# Patient Record
Sex: Male | Born: 1985 | Race: White | Hispanic: No | Marital: Married | Smoking: Never smoker
Health system: Southern US, Community
[De-identification: ages and names within clinical notes are randomized; demographics above are authoritative.]

## PROBLEM LIST (undated history)

## (undated) DIAGNOSIS — M549 Dorsalgia, unspecified: Secondary | ICD-10-CM

---

## 2012-02-25 ENCOUNTER — Emergency Department (HOSPITAL_COMMUNITY)
Admission: EM | Admit: 2012-02-25 | Discharge: 2012-02-25 | Disposition: A | Discharge status: Home / Self Care | Payer: Self-pay | Attending: Emergency Medicine | Admitting: Emergency Medicine

## 2012-02-25 ENCOUNTER — Encounter (HOSPITAL_COMMUNITY): Payer: Self-pay | Admitting: *Deleted

## 2012-02-25 DIAGNOSIS — R112 Nausea with vomiting, unspecified: Secondary | ICD-10-CM | POA: Insufficient documentation

## 2012-02-25 DIAGNOSIS — R109 Unspecified abdominal pain: Secondary | ICD-10-CM | POA: Insufficient documentation

## 2012-02-25 DIAGNOSIS — R1110 Vomiting, unspecified: Secondary | ICD-10-CM | POA: Insufficient documentation

## 2012-02-25 LAB — CBC
HCT: 47.2 % (ref 39.0–52.0)
Platelets: 220 10*3/uL (ref 150–400)
RBC: 5.24 MIL/uL (ref 4.22–5.81)
RDW: 12 % (ref 11.5–15.5)
WBC: 10.1 10*3/uL (ref 4.0–10.5)

## 2012-02-25 LAB — DIFFERENTIAL
Basophils Absolute: 0 10*3/uL (ref 0.0–0.1)
Lymphocytes Relative: 18 % (ref 12–46)
Lymphs Abs: 1.9 10*3/uL (ref 0.7–4.0)
Monocytes Absolute: 0.8 10*3/uL (ref 0.1–1.0)
Neutro Abs: 7.5 10*3/uL (ref 1.7–7.7)

## 2012-02-25 LAB — COMPREHENSIVE METABOLIC PANEL
ALT: 22 U/L (ref 0–53)
AST: 40 U/L — ABNORMAL HIGH (ref 0–37)
CO2: 30 mEq/L (ref 19–32)
Chloride: 97 mEq/L (ref 96–112)
GFR calc non Af Amer: 73 mL/min — ABNORMAL LOW (ref >90)
Sodium: 140 mEq/L (ref 135–145)
Total Bilirubin: 0.9 mg/dL (ref 0.3–1.2)

## 2012-02-25 LAB — URINALYSIS, ROUTINE W REFLEX MICROSCOPIC
Bilirubin Urine: NEGATIVE
Glucose, UA: NEGATIVE mg/dL
Hgb urine dipstick: NEGATIVE
Ketones, ur: NEGATIVE mg/dL
pH: 7 (ref 5.0–8.0)

## 2012-02-25 MED ORDER — PROMETHAZINE HCL 25 MG PO TABS: 25.0000 mg | ORAL_TABLET | Freq: Four times a day (QID) | ORAL | Status: DC | PRN

## 2012-02-25 MED ORDER — ONDANSETRON 4 MG PO TBDP
8.0000 mg | ORAL_TABLET | Freq: Once | ORAL | Status: AC
  Administered 2012-02-25: 8 mg via ORAL
  Filled 2012-02-25: qty 2

## 2012-02-25 MED ORDER — OXYCODONE-ACETAMINOPHEN 5-325 MG PO TABS
1.0000 | ORAL_TABLET | Freq: Once | ORAL | Status: AC
  Administered 2012-02-25: 1 via ORAL
  Filled 2012-02-25: qty 1

## 2012-02-25 MED ORDER — HYDROCODONE-ACETAMINOPHEN 5-325 MG PO TABS: 1.0000 | ORAL_TABLET | Freq: Four times a day (QID) | ORAL | Status: AC | PRN

## 2012-02-25 NOTE — ED Notes (Signed)
Pt ambulated with a steady gait; VSS; A&Ox3; no signs of distress; respirations even and unlabored; skin warm and dry. No questions at this time.  

## 2012-02-25 NOTE — ED Notes (Signed)
Epigastric pain since last night. Intermittent, sharp pain. Emesis last night from 11 pm 0600. States, "blood emesis."

## 2012-02-25 NOTE — ED Provider Notes (Signed)
Medical screening examination/treatment/procedure(s) were performed by non-physician practitioner and as supervising physician I was immediately available for consultation/collaboration.   Celene Kras, MD 02/25/12 2038

## 2012-02-25 NOTE — ED Notes (Signed)
PT has kept down cup of water.

## 2012-02-25 NOTE — ED Notes (Signed)
PT reports his is feeling better; pain decreased. Will attempt fluid challenge.

## 2012-02-25 NOTE — ED Provider Notes (Signed)
History     CSN: [id number]  Arrival date & time 02/25/12  1519   First MD Initiated Contact with Patient 02/25/12 1840      Chief Complaint  Patient presents with  . Abdominal Pain    (Consider location/radiation/quality/duration/timing/severity/associated sxs/prior treatment) HPI Comments: Patient with no significant past medical history presents emergency department chief complaint of epigastric pain.  Onset of pain began last night around 11 p.m. and patient has had 5 episodes of emesis last night and 2 this morning.  The last emesis episode had bright red blood.  Patient denies any hematochezia, fevers, night sweats, chills, melena or abdominal pain.  Patient denies smoking, alcohol use, or anti-inflammatory use including Motrin, ibuprofen and Aleve.  Patient has no other complaints this time.  Patient is a 26 y.o. male presenting with abdominal pain. The history is provided by the patient.  Abdominal Pain The primary symptoms of the illness include abdominal pain, nausea and vomiting. The primary symptoms of the illness do not include fever, shortness of breath, diarrhea or dysuria.  Symptoms associated with the illness do not include chills, constipation, urgency or hematuria.    History reviewed. No pertinent past medical history.  History reviewed. No pertinent past surgical history.  History reviewed. No pertinent family history.  History  Substance Use Topics  . Smoking status: Not on file  . Smokeless tobacco: Not on file  . Alcohol Use: Yes      Review of Systems  Constitutional: Positive for appetite change. Negative for fever and chills.  HENT: Negative for neck stiffness and dental problem.   Eyes: Negative for visual disturbance.  Respiratory: Negative for cough, chest tightness, shortness of breath and wheezing.   Cardiovascular: Negative for chest pain.  Gastrointestinal: Positive for nausea, vomiting and abdominal pain. Negative for diarrhea,  constipation, blood in stool, abdominal distention, anal bleeding and rectal pain.  Genitourinary: Negative for dysuria, urgency, hematuria and flank pain.  Musculoskeletal: Negative for myalgias and arthralgias.  Skin: Negative for rash.  Neurological: Negative for dizziness, syncope, speech difficulty, numbness and headaches.  Hematological: Does not bruise/bleed easily.  All other systems reviewed and are negative.    Allergies  Review of patient's allergies indicates no known allergies.  Home Medications  No current outpatient prescriptions on file.  BP 141/86  Pulse 66  Temp 98.5 F (36.9 C) (Oral)  Resp 18  Ht 5\' 10"  (1.778 m)  Wt 180 lb (81.647 kg)  BMI 25.83 kg/m2  SpO2 100%  Physical Exam  Constitutional: He is oriented to person, place, and time. He appears well-developed and well-nourished. No distress.  HENT:  Head: Normocephalic and atraumatic.  Mouth/Throat: Oropharynx is clear and moist. No oropharyngeal exudate.  Eyes: Conjunctivae and EOM are normal. Pupils are equal, round, and reactive to light. No scleral icterus.  Neck: Normal range of motion. Neck supple. No tracheal deviation present. No thyromegaly present.  Cardiovascular: Normal rate, regular rhythm, normal heart sounds and intact distal pulses.   Pulmonary/Chest: Effort normal and breath sounds normal. No stridor. No respiratory distress. He has no wheezes.  Abdominal: Soft.       No localized ttp. Bowel sounds present.   Musculoskeletal: Normal range of motion. He exhibits no edema and no tenderness.  Neurological: He is alert and oriented to person, place, and time. Coordination normal.  Skin: Skin is warm and dry. No rash noted. He is not diaphoretic. No erythema. No pallor.  Psychiatric: He has a normal mood and affect. His  behavior is normal.    ED Course  Procedures (including critical care time)  Labs Reviewed  COMPREHENSIVE METABOLIC PANEL - Abnormal; Notable for the following:     Glucose, Bld 103 (*)     Total Protein 8.6 (*)     AST 40 (*)     GFR calc non Af Amer 73 (*)     GFR calc Af Amer 85 (*)     All other components within normal limits  CBC  DIFFERENTIAL  AMYLASE  LIPASE, BLOOD  URINALYSIS, ROUTINE W REFLEX MICROSCOPIC   No results found.   No diagnosis found.    MDM  Emesis   Patient to ED w c/o emesis and epigastric abd pain.  Vitals arenormal, no fever.  No signs of dehydration, tolerating PO fluids > 6 oz.  Lungs are clear.  No focal abdominal pain, no concern for appendicitis, cholecystitis, pancreatitis, ruptured viscus, UTI, kidney stone, or any other abdominal etiology.  Supportive therapy indicated with return if symptoms worsen.  Patient counseled.         Jaci Carrel, [place] 02/25/12 2031

## 2013-03-11 ENCOUNTER — Other Ambulatory Visit: Payer: Self-pay | Admitting: Orthopedic Surgery

## 2013-03-11 ENCOUNTER — Encounter (HOSPITAL_COMMUNITY): Payer: Self-pay | Admitting: *Deleted

## 2013-03-11 NOTE — H&P (Signed)
Shane Jimenez is an 27 y.o. male.   Chief Complaint: back and left leg pain HPI: back and left leg pain since March when he jumped while playing basketball. He has ongoing weakness, numbness, tingling LLE refractory to conservative tx.  Past Medical History  Diagnosis Date  . Acute back pain     No past surgical history on file.  No family history on file. Social History:  reports that he has never smoked. He does not have any smokeless tobacco history on file. He reports that he does not drink alcohol or use illicit drugs.  Allergies: No Known Allergies   (Not in a hospital admission)  No results found for this or any previous visit (from the past 48 hour(s)). No results found.  Review of Systems  Constitutional: Negative.   HENT: Negative.   Eyes: Negative.   Cardiovascular: Negative.   Gastrointestinal: Negative.   Genitourinary: Negative.   Musculoskeletal: Positive for back pain.  Skin: Negative.   Neurological: Positive for sensory change and focal weakness.  Psychiatric/Behavioral: Negative.     There were no vitals taken for this visit. Physical Exam  Constitutional: He is oriented to person, place, and time. He appears well-developed and well-nourished. He appears distressed.  HENT:  Head: Normocephalic and atraumatic.  Eyes: Conjunctivae and EOM are normal. Pupils are equal, round, and reactive to light.  Neck: Normal range of motion. Neck supple.  Cardiovascular: Normal rate and regular rhythm.   Respiratory: Effort normal and breath sounds normal.  GI: Soft. Bowel sounds are normal.  Musculoskeletal:  Limited ROM Lspine + SLR Decreased sensation LLE Weakness L  EHL  Neurological: He is alert and oriented to person, place, and time.  Skin: Skin is warm and dry.  Psychiatric: He has a normal mood and affect.    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 All questions answered and pt desires to proceed.  Plan microlumbar decompression L4-5   BISSELL, JACLYN M. for Dr. Shelle Iron 03/11/2013, 5:33 PM

## 2013-03-12 ENCOUNTER — Encounter (HOSPITAL_COMMUNITY): Payer: Self-pay | Admitting: Anesthesiology

## 2013-03-12 ENCOUNTER — Encounter (HOSPITAL_COMMUNITY): Payer: Self-pay | Admitting: *Deleted

## 2013-03-12 ENCOUNTER — Ambulatory Visit (HOSPITAL_COMMUNITY): Payer: BC Managed Care – PPO | Admitting: Anesthesiology

## 2013-03-12 ENCOUNTER — Encounter (HOSPITAL_COMMUNITY)
Admission: RE | Disposition: A | Discharge status: Home / Self Care | Payer: Self-pay | Source: Ambulatory Visit | Attending: Specialist

## 2013-03-12 ENCOUNTER — Ambulatory Visit (HOSPITAL_COMMUNITY): Payer: BC Managed Care – PPO

## 2013-03-12 ENCOUNTER — Ambulatory Visit (HOSPITAL_COMMUNITY)
Admission: RE | Admit: 2013-03-12 | Discharge: 2013-03-13 | Disposition: A | Discharge status: Home / Self Care | Payer: BC Managed Care – PPO | Source: Ambulatory Visit | Attending: Specialist | Admitting: Specialist

## 2013-03-12 DIAGNOSIS — M48061 Spinal stenosis, lumbar region without neurogenic claudication: Secondary | ICD-10-CM | POA: Insufficient documentation

## 2013-03-12 DIAGNOSIS — M48062 Spinal stenosis, lumbar region with neurogenic claudication: Secondary | ICD-10-CM

## 2013-03-12 DIAGNOSIS — M5126 Other intervertebral disc displacement, lumbar region: Secondary | ICD-10-CM | POA: Insufficient documentation

## 2013-03-12 HISTORY — DX: Dorsalgia, unspecified: M54.9

## 2013-03-12 HISTORY — PX: LUMBAR LAMINECTOMY/DECOMPRESSION MICRODISCECTOMY: SHX5026

## 2013-03-12 SURGERY — LUMBAR LAMINECTOMY/DECOMPRESSION MICRODISCECTOMY
Anesthesia: General | Site: Spine Lumbar | Wound class: Clean

## 2013-03-12 MED ORDER — MENTHOL 3 MG MT LOZG: 1.0000 | LOZENGE | OROMUCOSAL | Status: DC | PRN

## 2013-03-12 MED ORDER — CEFAZOLIN SODIUM-DEXTROSE 2-3 GM-% IV SOLR
INTRAVENOUS | Status: AC
  Filled 2013-03-12: qty 50

## 2013-03-12 MED ORDER — LACTATED RINGERS IV SOLN
INTRAVENOUS | Status: DC
  Administered 2013-03-12 (×2): via INTRAVENOUS

## 2013-03-12 MED ORDER — BUPIVACAINE-EPINEPHRINE (PF) 0.5% -1:200000 IJ SOLN
INTRAMUSCULAR | Status: AC
  Filled 2013-03-12: qty 10

## 2013-03-12 MED ORDER — ONDANSETRON HCL 4 MG/2ML IJ SOLN
INTRAMUSCULAR | Status: DC | PRN
  Administered 2013-03-12: 4 mg via INTRAVENOUS

## 2013-03-12 MED ORDER — FENTANYL CITRATE 0.05 MG/ML IJ SOLN
INTRAMUSCULAR | Status: DC | PRN
  Administered 2013-03-12: 100 ug via INTRAVENOUS
  Administered 2013-03-12: 50 ug via INTRAVENOUS
  Administered 2013-03-12: 100 ug via INTRAVENOUS
  Administered 2013-03-12 (×2): 50 ug via INTRAVENOUS

## 2013-03-12 MED ORDER — SODIUM CHLORIDE 0.9 % IJ SOLN: 3.0000 mL | Freq: Two times a day (BID) | INTRAMUSCULAR | Status: DC

## 2013-03-12 MED ORDER — ONDANSETRON HCL 4 MG/2ML IJ SOLN
4.0000 mg | INTRAMUSCULAR | Status: DC | PRN
  Administered 2013-03-13: 4 mg via INTRAVENOUS
  Filled 2013-03-12: qty 2

## 2013-03-12 MED ORDER — BUPIVACAINE-EPINEPHRINE 0.5% -1:200000 IJ SOLN
INTRAMUSCULAR | Status: DC | PRN
  Administered 2013-03-12: 5 mL

## 2013-03-12 MED ORDER — METHOCARBAMOL 500 MG PO TABS
500.0000 mg | ORAL_TABLET | Freq: Four times a day (QID) | ORAL | Status: DC | PRN
  Administered 2013-03-13 (×2): 500 mg via ORAL
  Filled 2013-03-12 (×3): qty 1

## 2013-03-12 MED ORDER — OXYCODONE-ACETAMINOPHEN 5-325 MG PO TABS
1.0000 | ORAL_TABLET | ORAL | Status: DC | PRN
  Administered 2013-03-13: 2 via ORAL
  Filled 2013-03-12: qty 2

## 2013-03-12 MED ORDER — LIDOCAINE HCL (CARDIAC) 20 MG/ML IV SOLN
INTRAVENOUS | Status: DC | PRN
  Administered 2013-03-12: 50 mg via INTRAVENOUS

## 2013-03-12 MED ORDER — HYDROCODONE-ACETAMINOPHEN 7.5-325 MG PO TABS: 1.0000 | ORAL_TABLET | ORAL | Status: AC | PRN

## 2013-03-12 MED ORDER — PROMETHAZINE HCL 25 MG/ML IJ SOLN: 6.2500 mg | INTRAMUSCULAR | Status: DC | PRN

## 2013-03-12 MED ORDER — HYDROCODONE-ACETAMINOPHEN 5-325 MG PO TABS
1.0000 | ORAL_TABLET | ORAL | Status: DC | PRN
  Filled 2013-03-12: qty 2

## 2013-03-12 MED ORDER — CEFAZOLIN SODIUM-DEXTROSE 2-3 GM-% IV SOLR
2.0000 g | Freq: Three times a day (TID) | INTRAVENOUS | Status: AC
  Administered 2013-03-12 – 2013-03-13 (×2): 2 g via INTRAVENOUS
  Filled 2013-03-12 (×2): qty 50

## 2013-03-12 MED ORDER — THROMBIN 5000 UNITS EX SOLR
CUTANEOUS | Status: AC
  Filled 2013-03-12: qty 10000

## 2013-03-12 MED ORDER — POLYMYXIN B SULFATE 500000 UNITS IJ SOLR
INTRAMUSCULAR | Status: DC | PRN
  Administered 2013-03-12: 16:00:00

## 2013-03-12 MED ORDER — HYDROMORPHONE HCL PF 1 MG/ML IJ SOLN
0.5000 mg | INTRAMUSCULAR | Status: DC | PRN
  Administered 2013-03-12 (×2): 0.5 mg via INTRAVENOUS
  Administered 2013-03-13 (×3): 1 mg via INTRAVENOUS
  Filled 2013-03-12 (×5): qty 1

## 2013-03-12 MED ORDER — SODIUM CHLORIDE 0.45 % IV SOLN
INTRAVENOUS | Status: DC
  Administered 2013-03-12: 21:00:00 via INTRAVENOUS

## 2013-03-12 MED ORDER — THROMBIN 5000 UNITS EX SOLR
CUTANEOUS | Status: DC | PRN
  Administered 2013-03-12: 10000 [IU] via TOPICAL

## 2013-03-12 MED ORDER — CLINDAMYCIN PHOSPHATE 900 MG/50ML IV SOLN
INTRAVENOUS | Status: DC | PRN
  Administered 2013-03-12: 900 mg via INTRAVENOUS

## 2013-03-12 MED ORDER — HYDROMORPHONE HCL PF 1 MG/ML IJ SOLN: 0.2500 mg | INTRAMUSCULAR | Status: DC | PRN

## 2013-03-12 MED ORDER — ROCURONIUM BROMIDE 100 MG/10ML IV SOLN
INTRAVENOUS | Status: DC | PRN
  Administered 2013-03-12: 10 mg via INTRAVENOUS
  Administered 2013-03-12: 20 mg via INTRAVENOUS
  Administered 2013-03-12 (×2): 10 mg via INTRAVENOUS
  Administered 2013-03-12: 40 mg via INTRAVENOUS
  Administered 2013-03-12: 5 mg via INTRAVENOUS

## 2013-03-12 MED ORDER — ACETAMINOPHEN 325 MG PO TABS: 650.0000 mg | ORAL_TABLET | ORAL | Status: DC | PRN

## 2013-03-12 MED ORDER — SODIUM CHLORIDE 0.9 % IV SOLN: 250.0000 mL | INTRAVENOUS | Status: DC

## 2013-03-12 MED ORDER — CEFAZOLIN SODIUM-DEXTROSE 2-3 GM-% IV SOLR
2.0000 g | INTRAVENOUS | Status: AC
  Administered 2013-03-12: 2 g via INTRAVENOUS

## 2013-03-12 MED ORDER — SODIUM CHLORIDE 0.9 % IJ SOLN: 3.0000 mL | INTRAMUSCULAR | Status: DC | PRN

## 2013-03-12 MED ORDER — MUPIROCIN 2 % EX OINT
TOPICAL_OINTMENT | CUTANEOUS | Status: AC
  Administered 2013-03-12: 1
  Filled 2013-03-12: qty 22

## 2013-03-12 MED ORDER — MIDAZOLAM HCL 5 MG/5ML IJ SOLN
INTRAMUSCULAR | Status: DC | PRN
  Administered 2013-03-12: 2 mg via INTRAVENOUS

## 2013-03-12 MED ORDER — METHOCARBAMOL 500 MG PO TABS: 500.0000 mg | ORAL_TABLET | Freq: Three times a day (TID) | ORAL | Status: AC | PRN

## 2013-03-12 MED ORDER — BUPIVACAINE-EPINEPHRINE PF 0.25-1:200000 % IJ SOLN
INTRAMUSCULAR | Status: AC
  Filled 2013-03-12: qty 30

## 2013-03-12 MED ORDER — DEXTROSE 5 % IV SOLN
500.0000 mg | Freq: Four times a day (QID) | INTRAVENOUS | Status: DC | PRN
  Filled 2013-03-12: qty 5

## 2013-03-12 MED ORDER — LACTATED RINGERS IV SOLN: INTRAVENOUS | Status: DC

## 2013-03-12 MED ORDER — CLINDAMYCIN PHOSPHATE 900 MG/50ML IV SOLN
INTRAVENOUS | Status: AC
  Filled 2013-03-12: qty 50

## 2013-03-12 MED ORDER — NEOSTIGMINE METHYLSULFATE 1 MG/ML IJ SOLN
INTRAMUSCULAR | Status: DC | PRN
  Administered 2013-03-12: 4 mg via INTRAVENOUS

## 2013-03-12 MED ORDER — PHENYLEPHRINE HCL 10 MG/ML IJ SOLN
INTRAMUSCULAR | Status: DC | PRN
  Administered 2013-03-12: 40 ug via INTRAVENOUS

## 2013-03-12 MED ORDER — DOCUSATE SODIUM 100 MG PO CAPS
100.0000 mg | ORAL_CAPSULE | Freq: Two times a day (BID) | ORAL | Status: DC
  Administered 2013-03-12 – 2013-03-13 (×2): 100 mg via ORAL
  Filled 2013-03-12 (×3): qty 1

## 2013-03-12 MED ORDER — CHLORHEXIDINE GLUCONATE 4 % EX LIQD
60.0000 mL | Freq: Once | CUTANEOUS | Status: DC
  Filled 2013-03-12: qty 60

## 2013-03-12 MED ORDER — PHENOL 1.4 % MT LIQD: 1.0000 | OROMUCOSAL | Status: DC | PRN

## 2013-03-12 MED ORDER — GLYCOPYRROLATE 0.2 MG/ML IJ SOLN
INTRAMUSCULAR | Status: DC | PRN
  Administered 2013-03-12: .7 mg via INTRAVENOUS

## 2013-03-12 MED ORDER — ACETAMINOPHEN 650 MG RE SUPP: 650.0000 mg | RECTAL | Status: DC | PRN

## 2013-03-12 SURGICAL SUPPLY — 54 items
BAG ZIPLOCK 12X15 (MISCELLANEOUS) ×2 IMPLANT
CHLORAPREP W/TINT 26ML (MISCELLANEOUS) IMPLANT
CLEANER TIP ELECTROSURG 2X2 (MISCELLANEOUS) ×2 IMPLANT
CLOTH 2% CHLOROHEXIDINE 3PK (PERSONAL CARE ITEMS) ×2 IMPLANT
CLOTH BEACON ORANGE TIMEOUT ST (SAFETY) ×2 IMPLANT
DECANTER SPIKE VIAL GLASS SM (MISCELLANEOUS) IMPLANT
DRAPE MICROSCOPE LEICA (MISCELLANEOUS) ×2 IMPLANT
DRAPE POUCH INSTRU U-SHP 10X18 (DRAPES) ×2 IMPLANT
DRAPE SURG 17X11 SM STRL (DRAPES) ×2 IMPLANT
DRSG AQUACEL AG ADV 3.5X 4 (GAUZE/BANDAGES/DRESSINGS) ×2 IMPLANT
DRSG AQUACEL AG ADV 3.5X 6 (GAUZE/BANDAGES/DRESSINGS) IMPLANT
DRSG EMULSION OIL 3X3 NADH (GAUZE/BANDAGES/DRESSINGS) ×2 IMPLANT
DRSG PAD ABDOMINAL 8X10 ST (GAUZE/BANDAGES/DRESSINGS) IMPLANT
DRSG TELFA 4X5 ISLAND ADH (GAUZE/BANDAGES/DRESSINGS) IMPLANT
DURAFORM SPONGE 2X2 SINGLE (Neuro Prosthesis/Implant) ×2 IMPLANT
DURAPREP 26ML APPLICATOR (WOUND CARE) IMPLANT
DURASEAL SPINE SEALANT 3ML (MISCELLANEOUS) IMPLANT
ELECT BLADE TIP CTD 4 INCH (ELECTRODE) ×2 IMPLANT
ELECT REM PT RETURN 9FT ADLT (ELECTROSURGICAL) ×2
ELECTRODE REM PT RTRN 9FT ADLT (ELECTROSURGICAL) ×1 IMPLANT
GLOVE BIOGEL PI IND STRL 7.5 (GLOVE) ×1 IMPLANT
GLOVE BIOGEL PI IND STRL 8 (GLOVE) ×1 IMPLANT
GLOVE BIOGEL PI INDICATOR 7.5 (GLOVE) ×1
GLOVE BIOGEL PI INDICATOR 8 (GLOVE) ×1
GLOVE SURG SS PI 7.5 STRL IVOR (GLOVE) ×2 IMPLANT
GLOVE SURG SS PI 8.0 STRL IVOR (GLOVE) ×4 IMPLANT
GOWN STRL REIN XL XLG (GOWN DISPOSABLE) ×4 IMPLANT
IV CATH 14GX2 1/4 (CATHETERS) IMPLANT
KIT BASIN OR (CUSTOM PROCEDURE TRAY) ×2 IMPLANT
KIT POSITIONING SURG ANDREWS (MISCELLANEOUS) ×2 IMPLANT
MANIFOLD NEPTUNE II (INSTRUMENTS) ×2 IMPLANT
NEEDLE SPNL 18GX3.5 QUINCKE PK (NEEDLE) ×4 IMPLANT
PATTIES SURGICAL .5 X.5 (GAUZE/BANDAGES/DRESSINGS) IMPLANT
PATTIES SURGICAL .75X.75 (GAUZE/BANDAGES/DRESSINGS) IMPLANT
PATTIES SURGICAL 1X1 (DISPOSABLE) IMPLANT
SPONGE SURGIFOAM ABS GEL 100 (HEMOSTASIS) IMPLANT
STAPLER VISISTAT (STAPLE) IMPLANT
STRIP CLOSURE SKIN 1/2X4 (GAUZE/BANDAGES/DRESSINGS) IMPLANT
SUT NURALON 4 0 TR CR/8 (SUTURE) IMPLANT
SUT PROLENE 3 0 PS 2 (SUTURE) ×2 IMPLANT
SUT VIC AB 0 CT1 27 (SUTURE)
SUT VIC AB 0 CT1 27XBRD ANTBC (SUTURE) IMPLANT
SUT VIC AB 1 CT1 27 (SUTURE)
SUT VIC AB 1 CT1 27XBRD ANTBC (SUTURE) IMPLANT
SUT VIC AB 1-0 CT2 27 (SUTURE) IMPLANT
SUT VIC AB 2-0 CT1 27 (SUTURE)
SUT VIC AB 2-0 CT1 TAPERPNT 27 (SUTURE) IMPLANT
SUT VIC AB 2-0 CT2 27 (SUTURE) ×2 IMPLANT
SUT VICRYL 0 27 CT2 27 ABS (SUTURE) ×2 IMPLANT
SUT VICRYL 0 UR6 27IN ABS (SUTURE) IMPLANT
SYRINGE 10CC LL (SYRINGE) IMPLANT
TOWEL OR 17X26 10 PK STRL BLUE (TOWEL DISPOSABLE) ×2 IMPLANT
TRAY LAMINECTOMY (CUSTOM PROCEDURE TRAY) ×2 IMPLANT
YANKAUER SUCT BULB TIP NO VENT (SUCTIONS) ×2 IMPLANT

## 2013-03-12 NOTE — Anesthesia Postprocedure Evaluation (Signed)
  Anesthesia Post-op Note  Patient: Shane Jimenez  Procedure(s) Performed: Procedure(s) (LRB): LUMBAR LAMINECTOMY/DECOMPRESSION MICRODISCECTOMY (N/A)  Patient Location: PACU  Anesthesia Type: General  Level of Consciousness: awake and alert   Airway and Oxygen Therapy: Patient Spontanous Breathing  Post-op Pain: mild  Post-op Assessment: Post-op Vital signs reviewed, Patient's Cardiovascular Status Stable, Respiratory Function Stable, Patent Airway and No signs of Nausea or vomiting  Last Vitals:  Filed Vitals:   03/12/13 2126  BP: 118/64  Pulse: 52  Temp: 36.4 C  Resp: 14    Post-op Vital Signs: stable   Complications: No apparent anesthesia complications

## 2013-03-12 NOTE — Brief Op Note (Signed)
03/12/2013  5:54 PM  PATIENT:  Shane Jimenez  28 y.o. male  PRE-OPERATIVE DIAGNOSIS:  decompression L4 -5  POST-OPERATIVE DIAGNOSIS:  HNP lumber 4-5  PROCEDURE:  Procedure(s): LUMBAR LAMINECTOMY/DECOMPRESSION MICRODISCECTOMY (N/A)  SURGEON:  Surgeon(s) and Role:    * Javier Docker, MD - Primary    * Drucilla Schmidt, MD - Assisting  PHYSICIAN ASSISTANT:   ASSISTANTS: [place]   ANESTHESIA:   general  EBL:  Total I/O In: 1000 [I.V.:1000] Out: -   BLOOD ADMINISTERED:none  DRAINS: none   LOCAL MEDICATIONS USED:  MARCAINE     SPECIMEN:  No SpecimenL45 disc  DISPOSITION OF SPECIMEN:  PATHOLOGY  COUNTS:  YES  TOURNIQUET:  * No tourniquets in log *  DICTATION: .Other Dictation: Dictation Number [id number]  PLAN OF CARE: Admit for overnight observation  PATIENT DISPOSITION:  PACU - hemodynamically stable.   Delay start of Pharmacological VTE agent (>24hrs) due to surgical blood loss or risk of bleeding: yes

## 2013-03-12 NOTE — Transfer of Care (Signed)
Immediate Anesthesia Transfer of Care Note  Patient: Shane Jimenez  Procedure(s) Performed: Procedure(s) (LRB): LUMBAR LAMINECTOMY/DECOMPRESSION MICRODISCECTOMY (N/A)  Patient Location: PACU  Anesthesia Type: General  Level of Consciousness: sedated, patient cooperative and responds to stimulaton  Airway & Oxygen Therapy: Patient Spontanous Breathing and Patient connected to face mask oxgen  Post-op Assessment: Report given to PACU RN and Post -op Vital signs reviewed and stable  Post vital signs: Reviewed and stable  Complications: No apparent anesthesia complications

## 2013-03-12 NOTE — H&P (View-Only) (Signed)
Shane Jimenez is an 27 y.o. male.   Chief Complaint: back and left leg pain HPI: back and left leg pain since March when he jumped while playing basketball. He has ongoing weakness, numbness, tingling LLE refractory to conservative tx.  Past Medical History  Diagnosis Date  . Acute back pain     No past surgical history on file.  No family history on file. Social History:  reports that he has never smoked. He does not have any smokeless tobacco history on file. He reports that he does not drink alcohol or use illicit drugs.  Allergies: No Known Allergies   (Not in a hospital admission)  No results found for this or any previous visit (from the past 48 hour(s)). No results found.  Review of Systems  Constitutional: Negative.   HENT: Negative.   Eyes: Negative.   Cardiovascular: Negative.   Gastrointestinal: Negative.   Genitourinary: Negative.   Musculoskeletal: Positive for back pain.  Skin: Negative.   Neurological: Positive for sensory change and focal weakness.  Psychiatric/Behavioral: Negative.     There were no vitals taken for this visit. Physical Exam  Constitutional: He is oriented to person, place, and time. He appears well-developed and well-nourished. He appears distressed.  HENT:  Head: Normocephalic and atraumatic.  Eyes: Conjunctivae and EOM are normal. Pupils are equal, round, and reactive to light.  Neck: Normal range of motion. Neck supple.  Cardiovascular: Normal rate and regular rhythm.   Respiratory: Effort normal and breath sounds normal.  GI: Soft. Bowel sounds are normal.  Musculoskeletal:  Limited ROM Lspine + SLR Decreased sensation LLE Weakness L  EHL  Neurological: He is alert and oriented to person, place, and time.  Skin: Skin is warm and dry.  Psychiatric: He has a normal mood and affect.    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 All questions answered and pt desires to proceed.  Plan microlumbar decompression L4-5   Shane Jimenez M. for Dr. Beane 03/11/2013, 5:33 PM    

## 2013-03-12 NOTE — Anesthesia Preprocedure Evaluation (Addendum)
Anesthesia Evaluation  Patient identified by MRN, date of birth, ID band Patient awake    Reviewed: Allergy & Precautions, H&P , NPO status , Patient's Chart, lab work & pertinent test results  Airway Mallampati: II TM Distance: >3 FB Neck ROM: Full    Dental  (+) Teeth Intact and Dental Advisory Given,    Pulmonary neg pulmonary ROS,  breath sounds clear to auscultation  Pulmonary exam normal       Cardiovascular negative cardio ROS  Rhythm:Regular Rate:Normal     Neuro/Psych negative neurological ROS  negative psych ROS   GI/Hepatic negative GI ROS, Neg liver ROS,   Endo/Other  negative endocrine ROS  Renal/GU negative Renal ROS  negative genitourinary   Musculoskeletal negative musculoskeletal ROS (+)   Abdominal   Peds  Hematology negative hematology ROS (+)   Anesthesia Other Findings   Reproductive/Obstetrics negative OB ROS                          Anesthesia Physical Anesthesia Plan  ASA: I  Anesthesia Plan: General   Post-op Pain Management:    Induction: Intravenous  Airway Management Planned: Oral ETT  Additional Equipment:   Intra-op Plan:   Post-operative Plan: Extubation in OR  Informed Consent: I have reviewed the patients History and Physical, chart, labs and discussed the procedure including the risks, benefits and alternatives for the proposed anesthesia with the patient or authorized representative who has indicated his/her understanding and acceptance.   Dental advisory given  Plan Discussed with: CRNA  Anesthesia Plan Comments:        Anesthesia Quick Evaluation

## 2013-03-12 NOTE — Interval H&P Note (Signed)
History and Physical Interval Note:  03/12/2013 2:53 PM  Shane Jimenez  has presented today for surgery, with the diagnosis of decompression L4 -5  The various methods of treatment have been discussed with the patient and family. After consideration of risks, benefits and other options for treatment, the patient has consented to  Procedure(s): LUMBAR LAMINECTOMY/DECOMPRESSION MICRODISCECTOMY (N/A) as a surgical intervention .  The patient's history has been reviewed, patient examined, no change in status, stable for surgery.  I have reviewed the patient's chart and labs.  Questions were answered to the patient's satisfaction.     Noriel Guthrie C

## 2013-03-13 NOTE — Evaluation (Signed)
Physical Therapy One Time Evaluation/Discharge Patient Details Name: Shane Jimenez MRN: [id number] DOB: 1986-07-13 Today's Date: 03/13/2013 Time: 7829-5621 PT Time Calculation (min): 11 min  PT Assessment / Plan / Recommendation History of Present Illness  27 y.o. male admitted to [facility] for elective lumbar decompression, laminectomy and discectomy at L 4/5 on 03/12/13.  Clinical Impression  Pt is sore and moving slowly, but able to move about without external assist.  All education completed and pt will have necessary level of assist at discharge.      PT Assessment  Patent does not need any further PT services    Follow Up Recommendations  No PT follow up    Does the patient have the potential to tolerate intense rehabilitation     NA  Barriers to Discharge   none- he is able to do stairs and will do ok with his move to second story apartment next week (as far as accessing the apartment via stairs).        Equipment Recommendations  None recommended by PT    Recommendations for Other Services   none  Frequency   NA- one time eval and d/c.   Precautions / Restrictions Precautions Precautions: Back Precaution Booklet Issued: Yes (comment) Precaution Comments: handout given by OT, reviewed with PT.   Required Braces or Orthoses:  (none) Restrictions Weight Bearing Restrictions: No   Pertinent Vitals/Pain See vitals flow sheet.      Mobility Bed Mobility Bed Mobility: Rolling Left;Left Sidelying to Sit Rolling Left: 6: Modified independent (Device/Increase time) Left Sidelying to Sit: HOB flat;6: Modified independent (Device/Increase time) Details for Bed Mobility Assistance: reinforeced log roll education with bed mobility.  Pt moving slowly, but can do it without external assist.   Transfers Sit to Stand: 5: Supervision;From bed;With upper extremity assist Details for Transfer Assistance: Again, pt moving slowly, but that is normal with his post op soreness.   Ambulation/Gait Ambulation/Gait Assistance: 5: Supervision Ambulation Distance (Feet): 400 Feet Assistive device: None Ambulation/Gait Assistance Details: slow gait speed due to post op soreness, but no unsteadiness noted.  Pt did report he felt a little pulling/burning into his posterior thigh/hamstring similar to symptoms he felt PTA, but PTA more intese and went into his gastroc.   Gait Pattern: Step-through pattern Stairs: Yes Stairs Assistance: 5: Supervision Stairs Assistance Details (indicate cue type and reason): used railing for support, but was able to complete with reciprocal pattern.   Stair Management Technique: One rail Left;One rail Right;Alternating pattern;Forwards Number of Stairs: 4 (due to IV line we did not do more steps)       PT Goals(Current goals can be found in the care plan section) Acute Rehab PT Goals Patient Stated Goal: to go home and have pain go away in left leg.    Visit Information  Last PT Received On: 03/13/13 Assistance Needed: +1 History of Present Illness: 27 y.o. male admitted to [facility] for elective lumbar decompression, laminectomy and discectomy at L 4/5 on 03/12/13.       Prior Functioning  Home Living Family/patient expects to be discharged to:: Private residence Living Arrangements: Non-relatives/Friends Available Help at Discharge: Available PRN/intermittently (whenever he needs help) Home Layout: One level Additional Comments: moving this week to 2nd floor apt.  Currently no STE Prior Function Level of Independence:  (difficult to put shoes socks on) Communication Communication: No difficulties    Cognition  Cognition Arousal/Alertness: Awake/alert Behavior During Therapy: WFL for tasks assessed/performed Overall Cognitive Status: Within Functional Limits  for tasks assessed    Extremity/Trunk Assessment Upper Extremity Assessment Upper Extremity Assessment: Defer to OT evaluation Lower Extremity Assessment Lower Extremity  Assessment: Generalized weakness (due to post op soreness) Cervical / Trunk Assessment Cervical / Trunk Assessment: Normal      End of Session PT - End of Session Activity Tolerance: Patient tolerated treatment well Patient left: in bed;with call bell/phone within reach;with family/visitor present    Lurena Joiner B. Yajaira Doffing, PT, DPT [phone]   03/13/2013, 11:27 AM

## 2013-03-13 NOTE — Op Note (Signed)
NAME:  GAL, FELDHAUS NO.:  [id number]  MEDICAL RECORD NO.:  [id number]  LOCATION:  1442                         FACILITY:  [facility]  PHYSICIAN:  Jene Every, M.D.    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  PROCEDURE PERFORMED: 1. Micro lumbar decompression at L4-L5, left. 2. Foraminotomies at L4-L5. 3. Treatment free fragment, L4-L5.  ANESTHESIA:  General.  ASSISTANT:  Marlowe Kays, M.D.  HISTORY:  This is a 27 year old with retracted left lower extremity radicular pain secondary to huge large disk herniation compressing the sac in the L5 root with __________ dysesthesia failing conservative treatment and is indicated for decompression.  Risks and benefits were discussed including bleeding, infection, damage to neurovascular, DVT, PE, anesthetic complications, etc.  TECHNIQUE:  With the patient in supine position, after induction of adequate general anesthesia, 2 g Kefzol, 900 clindamycin, exposure to MRSA in the anesthetic field.  He was placed prone on the [place] frame. All bony prominences were well padded.  Lumbar region was prepped and draped in the usual sterile fashion.  An 18-gauge spinal needle was utilized to localize L4-L5 interspace, confirmed with x-ray.  Incision was made from spinous process of L4-L5.  Subcutaneous tissue was dissected.  Electrocautery was utilized to achieve hemostasis. Dorsolumbar fascia identified and divided in line with the skin incision.  Paraspinous muscle elevated from the lamina L4 and L5. McCullough retractor was placed.  Operating microscope was draped and brought into the surgical field.  Confirmatory radiographs obtained. Hemilaminotomy of the caudad edge of L4  was performed with 2 mm Kerrison, detached the ligamentum flavum.  Ligamentum flavum was detached from the cephalad edge of L5 with a straight curette.  We meticulously removed ligamentum flavum, first laterally due to the large central disk herniation compressing the sac after removing the ligamentum flavum from the interspace.  I came from laterally to medially, and gently protected the thecal sac identifying the disk herniation from beneath that.  After lysing adhesions, I performed the electrocautery with bipolar cautery and then performed a foraminotomy of L5.  Identified the L5 root, placed an annulotomy on the lateral aspect of the disk.  Removed multiple fragments with the micropituitary without displacing the thecal sac.  After few fragments were retrieved, we were able to gently mobilize the thecal sac medially.  Prior to that, I had removed ligamentum from the midline on top to decompress the dorsal compression of the thecal sac.  After multiple passes with a nerve hook and a micropituitary, we were able to retrieve a large subligamentous fragments, which were retrieved and sent to pathology.  Following that, there was excellent decompression of the thecal sac.  Due to the disk degeneration, we performed foraminotomy of L4 as well.  No disk herniation noted in the foramen of L4 and of L5.  We then crossed under the anulus with a Woodson retractor, further mobilized the intradiscal material with a straight upbiting pituitary, and performed a full diskectomy of herniated material.  Following this, confirmatory radiographs obtained.  A Woodson retractor placed cephalad to the pedicle of L4 without tension and caudad.  We checked beneath the thecal sac, the axilla and the shoulder of the L5 root.  No residual disk herniation.  We had 1 cm of excursion of the L5 root medial to pedicle without tension.  We then copiously irrigated the disk space with catheter antibiotic irrigation.   Inspection revealed no CSF leaks or active bleeding.  We then used thrombin-soaked Gelfoam for temporary hemostasis.  There was restoration of thecal sac following decompression.  We removed McCullough retractor.  No evidence of active bleeding.  Copiously irrigated.  Paraspinous musculature, closed the fascia with 1 Vicryl, subcu with 2-0, skin was closed with Prolene. Sterile dressing applied.  Placed supine on the hospital bed, extubated without difficulty, and transported to the recovery room in satisfactory condition.  The patient tolerated the procedure well.  No complications.  Minimal blood loss.  Dr. Simonne Come assistant.     Jene Every, M.D.     Cordelia Pen  D:  03/12/2013  T:  03/12/2013  Job:  [id number]

## 2013-03-13 NOTE — Evaluation (Signed)
Occupational Therapy Evaluation Patient Details Name: Shane Jimenez MRN: [id number] DOB: 1986/06/16 Today's Date: 03/13/2013 Time: 6578-4696 OT Time Calculation (min): 26 min  OT Assessment / Plan / Recommendation History of present illness     Clinical Impression   Pt was admitted for L4-5 decompression. All education was completed.  Pt does not need any further OT at this time.       OT Assessment  Patient does not need any further OT services    Follow Up Recommendations  No OT follow up    Barriers to Discharge      Equipment Recommendations  None recommended by OT    Recommendations for Other Services    Frequency       Precautions / Restrictions Precautions Precautions: Back Restrictions Weight Bearing Restrictions: No   Pertinent Vitals/Pain Back sore--premedicated and awaiting muscle relaxant but felt OK to get up.  Repositioned    ADL  Lower Body Bathing: Moderate assistance Where Assessed - Lower Body Bathing: Supine, head of bed flat;Unsupported sit to stand Lower Body Dressing: Maximal assistance;Moderate assistance (mod with sock aid) Where Assessed - Lower Body Dressing: Supine, head of bed flat;Unsupported sit to stand Toilet Transfer: Hydrographic surveyor Method: Sit to Barista: Comfort height toilet;Grab bars (has standard toilet with sink next to it) Transfers/Ambulation Related to ADLs: min guard to ambulate to bathroom ADL Comments: educatd on stepping over tub.  Also reviewed sitting backwards on commode if needed, as his toilet will be 2 1/2 inches lower than ours.  Reviewed adls with back precautions and handout given.  Pt practiced with sock aid, demonstrated reacher and toilet aide.  He will have as much help as he needs. Pt verbalizes understanding of all.      OT Diagnosis:    OT Problem List:   OT Treatment Interventions:     OT Goals(Current goals can be found in the care plan section)    Visit  Information  Last OT Received On: 03/13/13 Assistance Needed: +1       Prior Functioning     Home Living Family/patient expects to be discharged to:: Private residence Living Arrangements: Non-relatives/Friends Available Help at Discharge: Available PRN/intermittently (whenever he needs help) Home Layout: One level Additional Comments: moving this week to 2nd floor apt.  Currently no STE Prior Function Level of Independence:  (difficult to put shoes socks on) Communication Communication: No difficulties         Vision/Perception     Cognition  Cognition Arousal/Alertness: Awake/alert Behavior During Therapy: WFL for tasks assessed/performed Overall Cognitive Status: Within Functional Limits for tasks assessed    Extremity/Trunk Assessment Upper Extremity Assessment Upper Extremity Assessment: Overall WFL for tasks assessed     Mobility Bed Mobility Bed Mobility: Rolling Left;Left Sidelying to Sit Rolling Left: 5: Supervision Left Sidelying to Sit: 5: Supervision;HOB flat Details for Bed Mobility Assistance: cues for technique Transfers Transfers: Sit to Stand Sit to Stand: 5: Supervision;From toilet;From bed;With upper extremity assist Details for Transfer Assistance: cues to keep back straight     Exercise     Balance     End of Session OT - End of Session Activity Tolerance: Patient tolerated treatment well Patient left: in bed;with call bell/phone within reach;with family/visitor present  GO Functional Assessment Tool Used: clinical judgment/observation Functional Limitation: Self care Self Care Current Status (E9528): At least 40 percent but less than 60 percent impaired, limited or restricted Self Care Goal Status (U1324): At least 40  percent but less than 60 percent impaired, limited or restricted Self Care Discharge Status [phone]): At least 40 percent but less than 60 percent impaired, limited or restricted   [facility] 03/13/2013, 9:55  AM Marica Otter, OTR/L [phone] 03/13/2013

## 2013-03-13 NOTE — Progress Notes (Signed)
   Subjective: 1 Day Post-Op Procedure(s) (LRB): LUMBAR LAMINECTOMY/DECOMPRESSION MICRODISCECTOMY (N/A) Patient reports pain as mild.   Patient seen in rounds with Dr. Lequita Halt. Patient is well, and has had no acute complaints or problems Patient is ready to go home later today  Objective: Vital signs in last 24 hours: Temp:  [97.5 F (36.4 C)-98.6 F (37 C)] 98.6 F (37 C) (07/26 0509) Pulse Rate:  [52-74] 61 (07/26 0509) Resp:  [11-16] 14 (07/26 0509) BP: (118-138)/(52-93) 118/52 mmHg (07/26 0509) SpO2:  [97 %-100 %] 97 % (07/26 0509) Weight:  [76.476 kg (168 lb 9.6 oz)] 76.476 kg (168 lb 9.6 oz) (07/25 1900)  Intake/Output from previous day:  Intake/Output Summary (Last 24 hours) at 03/13/13 0746 Last data filed at 03/13/13 0600  Gross per 24 hour  Intake 2176.66 ml  Output   1800 ml  Net 376.66 ml    Intake/Output this shift:    Labs: No results found for this basename: HGB,  in the last 72 hours No results found for this basename: WBC, RBC, HCT, PLT,  in the last 72 hours No results found for this basename: NA, K, CL, CO2, BUN, CREATININE, GLUCOSE, CALCIUM,  in the last 72 hours No results found for this basename: LABPT, INR,  in the last 72 hours  EXAM: General - Patient is Alert, Appropriate and Oriented Extremity - Neurovascular intact Sensation intact distally Incision - clean, dry Motor Function - intact, moving foot and toes well on exam.   Assessment/Plan: 1 Day Post-Op Procedure(s) (LRB): LUMBAR LAMINECTOMY/DECOMPRESSION MICRODISCECTOMY (N/A) Procedure(s) (LRB): LUMBAR LAMINECTOMY/DECOMPRESSION MICRODISCECTOMY (N/A) Past Medical History  Diagnosis Date  . Acute back pain    Principal Problem:   Spinal stenosis, lumbar region, with neurogenic claudication  Estimated body mass index is 23.53 kg/(m^2) as calculated from the following:   Height as of this encounter: 5\' 11"  (1.803 m).   Weight as of this encounter: 76.476 kg (168 lb 9.6  oz). Discharge home  Diet - Regular diet Follow up - in 2 weeks Activity - WBAT Disposition - Home Condition Upon Discharge - Good D/C Meds - See DC Summary   PERKINS, ALEXZANDREW 03/13/2013, 7:46 AM

## 2013-03-13 NOTE — Progress Notes (Signed)
D/c instruction given. Pt and significant other verbalized understanding of instructions given. Would care and restrictions discussed

## 2013-03-15 ENCOUNTER — Encounter (HOSPITAL_COMMUNITY): Payer: Self-pay | Admitting: Specialist

## 2014-12-21 IMAGING — CR DG LUMBAR SPINE 2-3V
2 series · 2 of 2 positions shown · non-contrast
Comparison: None.

CLINICAL DATA: Preop lumbar spine decompression.  Herniated disc at
L4-L5.

LUMBAR SPINE - 2-3 VIEW

[t l-spine a.p.]
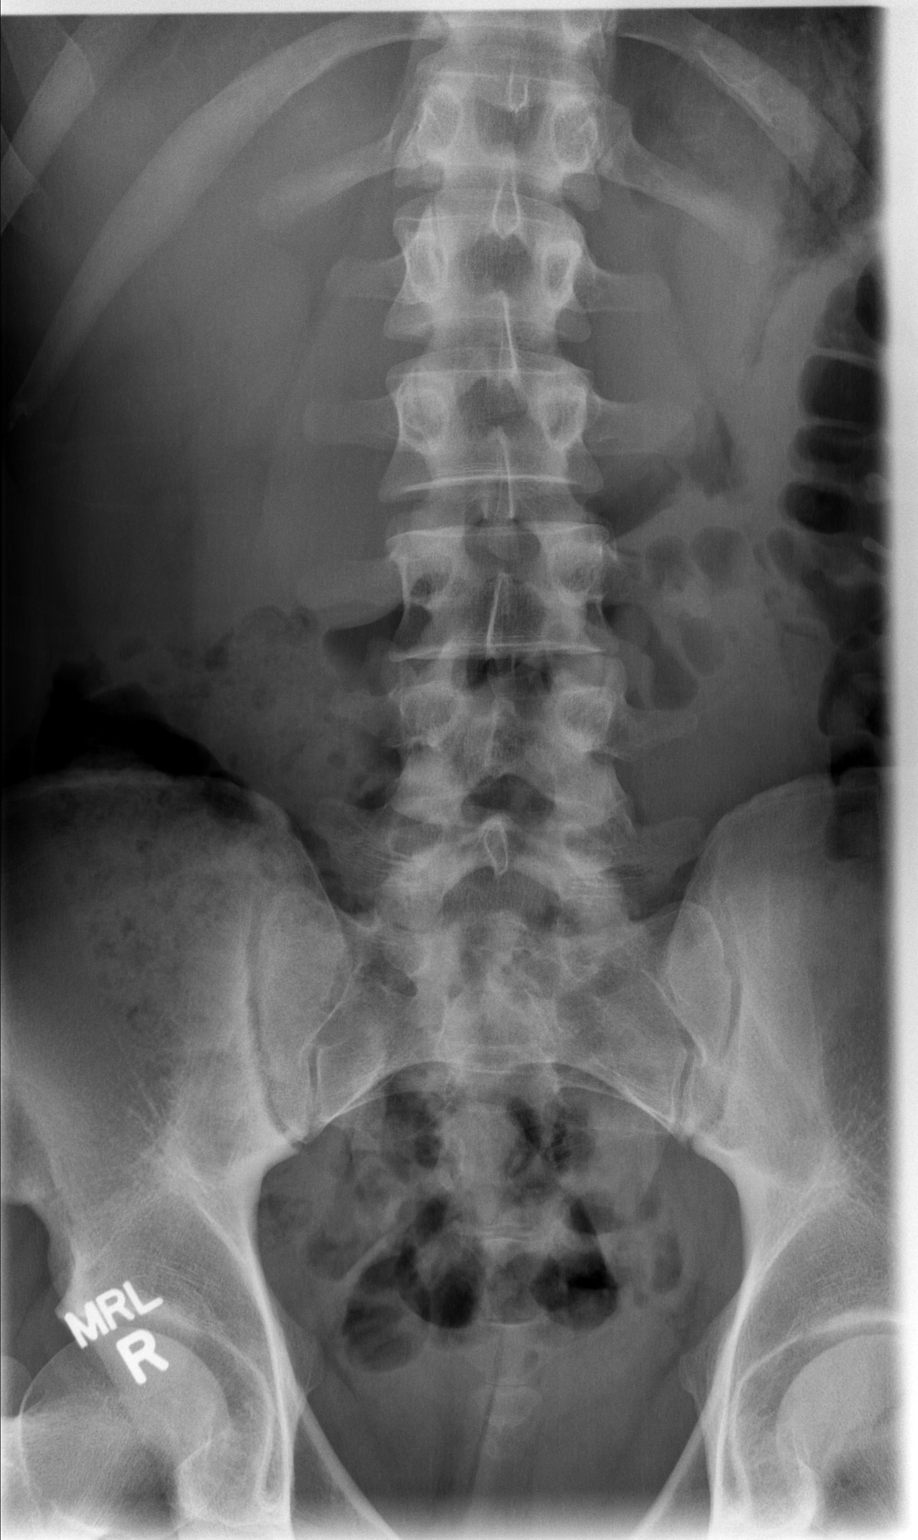

[t l-spine lat]
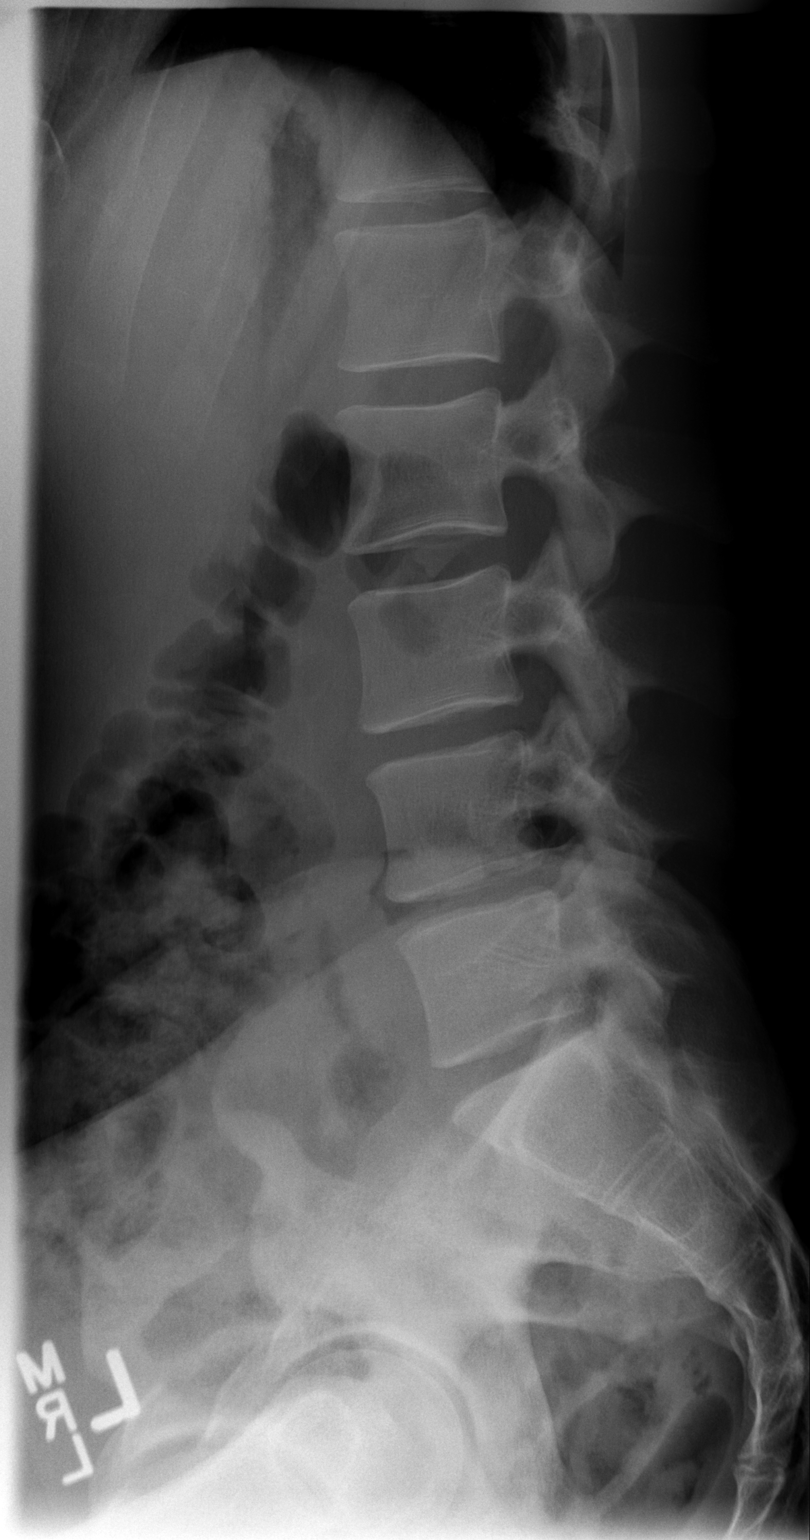

[2 of 2 positions shown; findings below may reference images not displayed]

FINDINGS: Two views of the lumbar spine were obtained.  Alignment
of the lumbar spine is normal.  The sacroiliac joints are
symmetric.  There is mild disc space narrowing at L4-L5.  No
evidence for acute fracture.  Vertebral body heights are
maintained.
IMPRESSION: No acute bony abnormality.

Mild disc space narrowing at L4-L5.

## 2020-04-05 ENCOUNTER — Other Ambulatory Visit: Payer: BC Managed Care – PPO

## 2021-10-04 ENCOUNTER — Emergency Department (HOSPITAL_COMMUNITY)
Admission: EM | Admit: 2021-10-04 | Discharge: 2021-10-04 | Disposition: A | Discharge status: Home / Self Care | Payer: BC Managed Care – PPO | Attending: Emergency Medicine | Admitting: Emergency Medicine

## 2021-10-04 ENCOUNTER — Encounter (HOSPITAL_COMMUNITY): Payer: Self-pay

## 2021-10-04 DIAGNOSIS — F32A Depression, unspecified: Secondary | ICD-10-CM | POA: Insufficient documentation

## 2021-10-04 DIAGNOSIS — R000 Tachycardia, unspecified: Secondary | ICD-10-CM | POA: Insufficient documentation

## 2021-10-04 DIAGNOSIS — F1010 Alcohol abuse, uncomplicated: Secondary | ICD-10-CM | POA: Insufficient documentation

## 2021-10-04 MED ORDER — CHLORDIAZEPOXIDE HCL 25 MG PO CAPS: ORAL_CAPSULE | ORAL | 0 refills | Status: AC

## 2021-10-04 MED ORDER — CHLORDIAZEPOXIDE HCL 25 MG PO CAPS
50.0000 mg | ORAL_CAPSULE | Freq: Once | ORAL | Status: AC
Start: 2021-10-04 — End: 2021-10-04
  Administered 2021-10-04: 50 mg via ORAL
  Filled 2021-10-04: qty 2

## 2021-10-04 NOTE — Discharge Instructions (Signed)
As discussed, it is important that he use the provided resources to ensure appropriate ongoing outpatient or inpatient rehabilitation efforts for alcohol abuse.  In addition, please obtain and take the prescribed medication, which will facilitate your recovery efforts.  Return here for concerning changes in your condition.

## 2021-10-04 NOTE — ED Triage Notes (Signed)
Pt presents with c/o depression and alcohol problem. Pt reports that he drinks every single day, last drink was last night. Pt reports struggling with depression, hx of same, has not been taking his medicine regularly. Pt denies SI.

## 2021-10-04 NOTE — ED Provider Notes (Signed)
[facility] DEPT Provider Note   CSN: [id number] Arrival date & time: 10/04/21  0848     History  Chief Complaint  Patient presents with   Depression   Alcohol Problem    Shane Jimenez is a 36 y.o. male.  HPI Patient presents with his wife who assists with history. Patient has a history of anxiety, depression, and alcohol abuse and is requesting treatment.  He has no history of complicated withdrawal.  He has not had rehabilitation efforts thus far.  They note that it is simply time for the patient to receive assistance, due to his alcohol abuse.  Last alcohol intake was yesterday.  He typically drinks about 1 pint of alcohol daily.  No physical pain, no suicidal or homicidal ideation.    Home Medications Prior to Admission medications   Medication Sig Start Date End Date Taking? Authorizing Provider  chlordiazePOXIDE (LIBRIUM) 25 MG capsule 50mg  PO TID x 1D, then 25-50mg  PO BID X 1D, then 25-50mg  PO QD X 1D 10/04/21  Yes Carmin Muskrat, MD  HYDROcodone-acetaminophen (NORCO) 7.5-325 MG per tablet Take 1-2 tablets by mouth every 4 (four) hours as needed for pain. 03/12/13   Susa Day, MD  methocarbamol (ROBAXIN) 500 MG tablet Take 1 tablet (500 mg total) by mouth 3 (three) times daily as needed. 03/12/13   Susa Day, MD      Allergies    Patient has no known allergies.    Review of Systems   Review of Systems  All other systems reviewed and are negative.  Physical Exam Updated Vital Signs BP 113/69 (BP Location: Left Arm)    Pulse (!) 118    Temp 97.7 F (36.5 C) (Oral)    Resp 18    SpO2 94%  Physical Exam Vitals and nursing note reviewed.  Constitutional:      General: He is not in acute distress.    Appearance: He is well-developed.  HENT:     Head: Normocephalic and atraumatic.  Eyes:     Conjunctiva/sclera: Conjunctivae normal.  Cardiovascular:     Rate and Rhythm: Regular rhythm. Tachycardia present.  Pulmonary:      Effort: Pulmonary effort is normal. No respiratory distress.     Breath sounds: No stridor.  Abdominal:     General: There is no distension.  Skin:    General: Skin is warm and dry.  Neurological:     Mental Status: He is alert and oriented to person, place, and time.  Psychiatric:        Mood and Affect: Mood normal.        Behavior: Behavior normal.        Thought Content: Thought content does not include homicidal or suicidal ideation.    ED Results / Procedures / Treatments   Labs (all labs ordered are listed, but only abnormal results are displayed) Labs Reviewed - No data to display  EKG None  Radiology No results found.  Procedures Procedures    Medications Ordered in ED Medications  chlordiazePOXIDE (LIBRIUM) capsule 50 mg (50 mg Oral Given 10/04/21 [id number])    ED Course/ Medical Decision Making/ A&P Adult male presents with concern of alcohol abuse.  No evidence for decompensated withdrawal, he is mildly tachycardic, likely due to his absence of alcohol intake since yesterday.  Patient started on benzodiazepine, and after lengthy conversation about rehabilitation options, without notable physical exam findings suggesting complications, patient was provided benzodiazepine prescription, multiple resources for inpatient and outpatient rehabilitation  efforts.  Peers to care include substance abuse, psychiatric disease.  Additional details obtained by his wife, as above.                          Final Clinical Impression(s) / ED Diagnoses Final diagnoses:  Alcohol abuse    Rx / DC Orders ED Discharge Orders          Ordered    chlordiazePOXIDE (LIBRIUM) 25 MG capsule        10/04/21 [id number]              Carmin Muskrat, MD 10/04/21 1008

## 2021-11-09 ENCOUNTER — Emergency Department (HOSPITAL_COMMUNITY)
Admission: EM | Admit: 2021-11-09 | Discharge: 2021-11-09 | Disposition: A | Discharge status: Home / Self Care | Payer: Self-pay | Attending: Emergency Medicine | Admitting: Emergency Medicine

## 2021-11-09 ENCOUNTER — Other Ambulatory Visit: Payer: Self-pay

## 2021-11-09 DIAGNOSIS — Z20822 Contact with and (suspected) exposure to covid-19: Secondary | ICD-10-CM | POA: Insufficient documentation

## 2021-11-09 DIAGNOSIS — Y908 Blood alcohol level of 240 mg/100 ml or more: Secondary | ICD-10-CM | POA: Insufficient documentation

## 2021-11-09 DIAGNOSIS — F1010 Alcohol abuse, uncomplicated: Secondary | ICD-10-CM | POA: Insufficient documentation

## 2021-11-09 DIAGNOSIS — Z046 Encounter for general psychiatric examination, requested by authority: Secondary | ICD-10-CM | POA: Insufficient documentation

## 2021-11-09 LAB — RAPID URINE DRUG SCREEN, HOSP PERFORMED
Amphetamines: NOT DETECTED
Barbiturates: NOT DETECTED
Benzodiazepines: NOT DETECTED
Cocaine: NOT DETECTED
Opiates: NOT DETECTED
Tetrahydrocannabinol: NOT DETECTED

## 2021-11-09 LAB — CBC
HCT: 44.6 % (ref 39.0–52.0)
Hemoglobin: 15 g/dL (ref 13.0–17.0)
MCH: 32.9 pg (ref 26.0–34.0)
MCHC: 33.6 g/dL (ref 30.0–36.0)
MCV: 97.8 fL (ref 80.0–100.0)
Platelets: 295 10*3/uL (ref 150–400)
RBC: 4.56 MIL/uL (ref 4.22–5.81)
RDW: 12.4 % (ref 11.5–15.5)
WBC: 11.1 10*3/uL — ABNORMAL HIGH (ref 4.0–10.5)
nRBC: 0 % (ref 0.0–0.2)

## 2021-11-09 LAB — COMPREHENSIVE METABOLIC PANEL
ALT: 44 U/L (ref 0–44)
AST: 47 U/L — ABNORMAL HIGH (ref 15–41)
Albumin: 4.6 g/dL (ref 3.5–5.0)
Alkaline Phosphatase: 49 U/L (ref 38–126)
Anion gap: 14 (ref 5–15)
BUN: 10 mg/dL (ref 6–20)
CO2: 22 mmol/L (ref 22–32)
Calcium: 8.7 mg/dL — ABNORMAL LOW (ref 8.9–10.3)
Chloride: 105 mmol/L (ref 98–111)
Creatinine, Ser: 1.02 mg/dL (ref 0.61–1.24)
GFR, Estimated: >60 mL/min (ref >60)
Glucose, Bld: 79 mg/dL (ref 70–99)
Potassium: 3.3 mmol/L — ABNORMAL LOW (ref 3.5–5.1)
Sodium: 141 mmol/L (ref 135–145)
Total Bilirubin: 0.6 mg/dL (ref 0.3–1.2)
Total Protein: 7.2 g/dL (ref 6.5–8.1)

## 2021-11-09 LAB — ACETAMINOPHEN LEVEL: Acetaminophen (Tylenol), Serum: <10 ug/mL — ABNORMAL LOW (ref 10–30)

## 2021-11-09 LAB — ETHANOL: Alcohol, Ethyl (B): 400 mg/dL (ref <10)

## 2021-11-09 LAB — RESP PANEL BY RT-PCR (FLU A&B, COVID) ARPGX2
Influenza A by PCR: NEGATIVE
Influenza B by PCR: NEGATIVE
SARS Coronavirus 2 by RT PCR: NEGATIVE

## 2021-11-09 LAB — SALICYLATE LEVEL: Salicylate Lvl: <7 mg/dL — ABNORMAL LOW (ref 7.0–30.0)

## 2021-11-09 NOTE — ED Notes (Signed)
Pt wanded by security. 

## 2021-11-09 NOTE — ED Provider Notes (Signed)
?[facility] EMERGENCY DEPARTMENT ?Provider Note ? ? ?CSN: [id number] ?Arrival date & time: 11/09/21  1843 ? ?  ? ?History ? ?Chief Complaint  ?Patient presents with  ? IVC   ? ? ?Harlan StainsBrian M Seabolt is a 36 y.o. male. ? ?Patient arrives IVC in place.  Patient admits alcohol use.  Admits to daily alcohol use.  He denies suicidal homicidal ideation.  IVC filled by his mother who is concerned about his drinking.  Possibly suicidal thoughts.  He is adamant that he is not suicidal.  Denies any physical complaints.   ? ?The history is provided by the patient and the police.  ? ?  ? ?Home Medications ?Prior to Admission medications   ?Medication Sig Start Date End Date Taking? Authorizing Provider  ?chlordiazePOXIDE (LIBRIUM) 25 MG capsule 50mg  PO TID x 1D, then 25-50mg  PO BID X 1D, then 25-50mg  PO QD X 1D 10/04/21   Gerhard MunchLockwood, Robert, MD  ?HYDROcodone-acetaminophen (NORCO) 7.5-325 MG per tablet Take 1-2 tablets by mouth every 4 (four) hours as needed for pain. 03/12/13   Jene EveryBeane, Jeffrey, MD  ?methocarbamol (ROBAXIN) 500 MG tablet Take 1 tablet (500 mg total) by mouth 3 (three) times daily as needed. 03/12/13   Jene EveryBeane, Jeffrey, MD  ?   ? ?Allergies    ?Patient has no known allergies.   ? ?Review of Systems   ?Review of Systems ? ?Physical Exam ?Updated Vital Signs ? ?ED Triage Vitals  ?Enc Vitals Group  ?   BP 11/09/21 1847 (!) 136/96  ?   Pulse Rate 11/09/21 1847 (!) 148  ?   Resp 11/09/21 1847 18  ?   Temp 11/09/21 1847 99 ?F (37.2 ?C)  ?   Temp Source 11/09/21 1847 Oral  ?   SpO2 11/09/21 1847 98 %  ?   Weight 11/09/21 1848 175 lb (79.4 kg)  ?   Height 11/09/21 1848 5\' 11"  (1.803 m)  ?   Head Circumference --   ?   Peak Flow --   ?   Pain Score 11/09/21 1848 0  ?   Pain Loc --   ?   Pain Edu? --   ?   Excl. in GC? --   ? ? ?Physical Exam ?Vitals and nursing note reviewed.  ?Constitutional:   ?   General: He is not in acute distress. ?   Appearance: He is well-developed. He is not ill-appearing.  ?HENT:  ?    Head: Normocephalic and atraumatic.  ?   Nose: Nose normal.  ?   Mouth/Throat:  ?   Mouth: Mucous membranes are moist.  ?Eyes:  ?   Extraocular Movements: Extraocular movements intact.  ?   Conjunctiva/sclera: Conjunctivae normal.  ?   Pupils: Pupils are equal, round, and reactive to light.  ?Cardiovascular:  ?   Rate and Rhythm: Normal rate and regular rhythm.  ?   Pulses: Normal pulses.  ?   Heart sounds: Normal heart sounds. No murmur heard. ?Pulmonary:  ?   Effort: Pulmonary effort is normal. No respiratory distress.  ?   Breath sounds: Normal breath sounds.  ?Abdominal:  ?   Palpations: Abdomen is soft.  ?   Tenderness: There is no abdominal tenderness.  ?Musculoskeletal:     ?   General: No swelling. Normal range of motion.  ?   Cervical back: Normal range of motion and neck supple.  ?Skin: ?   General: Skin is warm and dry.  ?   Capillary  Refill: Capillary refill takes less than 2 seconds.  ?Neurological:  ?   General: No focal deficit present.  ?   Mental Status: He is alert and oriented to person, place, and time.  ?   Cranial Nerves: No cranial nerve deficit.  ?   Sensory: No sensory deficit.  ?   Motor: No weakness.  ?   Coordination: Coordination normal.  ?   Comments: 5+ out of 5 strength, normal sensation, no drift  ?Psychiatric:     ?   Mood and Affect: Mood normal.     ?   Behavior: Behavior normal.     ?   Thought Content: Thought content normal.     ?   Judgment: Judgment normal.  ? ? ?ED Results / Procedures / Treatments   ?Labs ?(all labs ordered are listed, but only abnormal results are displayed) ?Labs Reviewed  ?COMPREHENSIVE METABOLIC PANEL - Abnormal; Notable for the following components:  ?    Result Value  ? Potassium 3.3 (*)   ? Calcium 8.7 (*)   ? AST 47 (*)   ? All other components within normal limits  ?ETHANOL - Abnormal; Notable for the following components:  ? Alcohol, Ethyl (B) 400 (*)   ? All other components within normal limits  ?SALICYLATE LEVEL - Abnormal; Notable for the  following components:  ? Salicylate Lvl <7.0 (*)   ? All other components within normal limits  ?ACETAMINOPHEN LEVEL - Abnormal; Notable for the following components:  ? Acetaminophen (Tylenol), Serum <10 (*)   ? All other components within normal limits  ?CBC - Abnormal; Notable for the following components:  ? WBC 11.1 (*)   ? All other components within normal limits  ?RESP PANEL BY RT-PCR (FLU A&B, COVID) [id number]  ?RAPID URINE DRUG SCREEN, HOSP PERFORMED  ? ? ?EKG ?None ? ?Radiology ?No results found. ? ?Procedures ?Procedures  ? ? ?Medications Ordered in ED ?Medications - No data to display ? ?ED Course/ Medical Decision Making/ A&P ?  ?                        ?Medical Decision Making ?Amount and/or Complexity of Data Reviewed ?Labs: ordered. ? ? ?TRAVUS OREN is a 36 year old male with history of alcohol abuse who presents with IVC filled.  Patient overall with elevated heart rate but improved upon my evaluation.  Patient with heavy alcohol use tonight.  Denies any suicidal homicidal ideation.  His mother filled out his IVC as concern for his alcohol use.  He is calm and clinically sober here.  I was able to talk on the phone with his wife with whom he lives with and she is not concerned about any suicidal thoughts.  He does drink daily and they are working on trying to get him to rehab.  She is not concerned for her safety or anybody safety at the home.  He does not exhibit any suicidal homicidal or aggressive behavior at home.  Overall I was able to contract for safety with both he and his wife.  Medical screening labs were ordered in triage.  His alcohol level was 400 originally.  But otherwise lab work is unremarkable.  Overall I rescinded IVC as patient mentally appears to be stable.  He has resources for rehab.  He is not ready for rehab at this time.  Overall patient discharged and understands return precautions. ? ?This chart was dictated using voice recognition software.  Despite  best efforts to  proofread,  errors can occur which can change the documentation meaning.  ? ? ? ? ? ? ? ?Final Clinical Impression(s) / ED Diagnoses ?Final diagnoses:  ?ETOH abuse  ? ? ?Rx / DC Orders ?ED Discharge Orders   ? ? None  ? ?  ? ? ?  ?Virgina Norfolk, DO ?11/10/21 0012 ? ?

## 2021-11-09 NOTE — ED Notes (Signed)
RN reviewed discharge instructions w/ pt. Follow up reviewed, pt had no further questions 

## 2021-11-09 NOTE — ED Notes (Signed)
Pt's mom called RN and reports pt had a grand mal seizure 1 wk ago (alcohol withdrawal), has had numbness in arms and legs, and has had auditory hallucinations (stating he his hearing voices). Per pt's mom pt has been using alcohol approx. a fifth every day.  ? ? ?Pt's mom, Dracen Fluharty, [phone] ?

## 2021-11-09 NOTE — ED Triage Notes (Signed)
Pt BIB [employer] IVC'ed by his mother. Per IVC patient sent picture of knife to friend threatening to kill himself. Per patient however he denies SI/ HI at this time. Pt tearful while questioned. Aox4.  ?

## 2021-11-09 NOTE — ED Notes (Signed)
[place] MD rescinded IVC paperwork ?

## 2021-11-09 NOTE — ED Provider Triage Note (Signed)
Emergency Medicine Provider Triage Evaluation Note ? ?Shane Jimenez , a 36 y.o. male  was evaluated in triage.  Pt complains of IVC.  Patient was apparently IVC by his mother..  Was reported that the patient sent a picture of a knife to his friend threatening to kill himself.  Patient states that this is not true.  He has no suicidal or homicidal ideations at this time.  He has never had any history of suicidal or homicidal thoughts.  He denies any hallucinations or delusions.  Denies any history of mental health.  He states that his mother is crazy.  ? ?Review of Systems  ?Positive:  ?Negative:  ? ?Physical Exam  ?BP (!) 136/96 (BP Location: Right Arm)   Pulse (!) 148   Temp 99 ?F (37.2 ?C) (Oral)   Resp 18   Ht 5\' 11"  (1.803 m)   Wt 79.4 kg   SpO2 98%   BMI 24.41 kg/m?  ?Gen:   Awake, no distress   ?Resp:  Normal effort  ?MSK:   Moves extremities without difficulty  ?Other:  Tearful  ? ?Medical Decision Making  ?Medically screening exam initiated at 7:02 PM.  Appropriate orders placed.  ALAZAR CHERIAN was informed that the remainder of the evaluation will be completed by another provider, this initial triage assessment does not replace that evaluation, and the importance of remaining in the ED until their evaluation is complete. ? ?Patient denies history of mental health, however per PD, patient has been IVC'd multiple times and this is not a new problem. ?  ?Shane Jimenez ?11/09/21 1904 ? ?
